# Patient Record
Sex: Male | Born: 1960 | Race: White | Hispanic: No | Marital: Single | Smoking: Current every day smoker
Health system: Southern US, Community
[De-identification: ages and names within clinical notes are randomized; demographics above are authoritative.]

## PROBLEM LIST (undated history)

## (undated) DIAGNOSIS — IMO0002 Reserved for concepts with insufficient information to code with codable children: Secondary | ICD-10-CM

## (undated) DIAGNOSIS — Z720 Tobacco use: Secondary | ICD-10-CM

---

## 2011-11-13 ENCOUNTER — Encounter (HOSPITAL_COMMUNITY): Payer: Self-pay | Admitting: Emergency Medicine

## 2011-11-13 ENCOUNTER — Emergency Department (HOSPITAL_COMMUNITY)
Admission: EM | Admit: 2011-11-13 | Discharge: 2011-11-13 | Disposition: A | Discharge status: Home / Self Care | Payer: Self-pay | Attending: Emergency Medicine | Admitting: Emergency Medicine

## 2011-11-13 ENCOUNTER — Emergency Department (HOSPITAL_COMMUNITY): Payer: Self-pay

## 2011-11-13 DIAGNOSIS — M62830 Muscle spasm of back: Secondary | ICD-10-CM

## 2011-11-13 DIAGNOSIS — M5380 Other specified dorsopathies, site unspecified: Secondary | ICD-10-CM | POA: Insufficient documentation

## 2011-11-13 DIAGNOSIS — R062 Wheezing: Secondary | ICD-10-CM | POA: Insufficient documentation

## 2011-11-13 DIAGNOSIS — R079 Chest pain, unspecified: Secondary | ICD-10-CM | POA: Insufficient documentation

## 2011-11-13 DIAGNOSIS — M549 Dorsalgia, unspecified: Secondary | ICD-10-CM | POA: Insufficient documentation

## 2011-11-13 HISTORY — DX: Reserved for concepts with insufficient information to code with codable children: IMO0002

## 2011-11-13 MED ORDER — HYDROMORPHONE HCL PF 1 MG/ML IJ SOLN
1.0000 mg | Freq: Once | INTRAMUSCULAR | Status: AC
  Administered 2011-11-13: 1 mg via INTRAMUSCULAR
  Filled 2011-11-13: qty 1

## 2011-11-13 MED ORDER — IBUPROFEN 600 MG PO TABS: 600.0000 mg | ORAL_TABLET | Freq: Four times a day (QID) | ORAL | Status: AC | PRN

## 2011-11-13 MED ORDER — OXYCODONE-ACETAMINOPHEN 5-325 MG PO TABS
1.0000 | ORAL_TABLET | Freq: Once | ORAL | Status: AC
  Administered 2011-11-13: 1 via ORAL
  Filled 2011-11-13: qty 1

## 2011-11-13 MED ORDER — CYCLOBENZAPRINE HCL 5 MG PO TABS: 5.0000 mg | ORAL_TABLET | Freq: Three times a day (TID) | ORAL | Status: AC | PRN

## 2011-11-13 MED ORDER — KETOROLAC TROMETHAMINE 60 MG/2ML IM SOLN
60.0000 mg | Freq: Once | INTRAMUSCULAR | Status: AC
  Administered 2011-11-13: 60 mg via INTRAMUSCULAR
  Filled 2011-11-13: qty 2

## 2011-11-13 MED ORDER — CYCLOBENZAPRINE HCL 10 MG PO TABS
10.0000 mg | ORAL_TABLET | Freq: Once | ORAL | Status: AC
  Administered 2011-11-13: 10 mg via ORAL
  Filled 2011-11-13: qty 1

## 2011-11-13 MED ORDER — CYCLOBENZAPRINE HCL 10 MG PO TABS: 5.0000 mg | ORAL_TABLET | Freq: Three times a day (TID) | ORAL | Status: AC | PRN

## 2011-11-13 NOTE — ED Provider Notes (Signed)
Medical screening examination/treatment/procedure(s) were performed by non-physician practitioner and as supervising physician I was immediately available for consultation/collaboration.  Donnetta Hutching, MD 11/13/11 2238

## 2011-11-13 NOTE — ED Notes (Signed)
Pt refuses to stay for observation time after Dilaudid  Injection. Left with wife.

## 2011-11-13 NOTE — Discharge Instructions (Signed)
Back Pain, Adult Low back pain is very common. About 1 in 5 people have back pain.The cause of low back pain is rarely dangerous. The pain often gets better over time.About half of people with a sudden onset of back pain feel better in just 2 weeks. About 8 in 10 people feel better by 6 weeks.  CAUSES Some common causes of back pain include:  Strain of the muscles or ligaments supporting the spine.   Wear and tear (degeneration) of the spinal discs.   Arthritis.   Direct injury to the back.  DIAGNOSIS Most of the time, the direct cause of low back pain is not known.However, back pain can be treated effectively even when the exact cause of the pain is unknown.Answering your caregiver's questions about your overall health and symptoms is one of the most accurate ways to make sure the cause of your pain is not dangerous. If your caregiver needs more information, he or she may order lab work or imaging tests (X-rays or MRIs).However, even if imaging tests show changes in your back, this usually does not require surgery. HOME CARE INSTRUCTIONS For many people, back pain returns.Since low back pain is rarely dangerous, it is often a condition that people can learn to manageon their own.   Remain active. It is stressful on the back to sit or stand in one place. Do not sit, drive, or stand in one place for more than 30 minutes at a time. Take short walks on level surfaces as soon as pain allows.Try to increase the length of time you walk each day.   Do not stay in bed.Resting more than 1 or 2 days can delay your recovery.   Do not avoid exercise or work.Your body is made to move.It is not dangerous to be active, even though your back may hurt.Your back will likely heal faster if you return to being active before your pain is gone.   Pay attention to your body when you bend and lift. Many people have less discomfortwhen lifting if they bend their knees, keep the load close to their  bodies,and avoid twisting. Often, the most comfortable positions are those that put less stress on your recovering back.   Find a comfortable position to sleep. Use a firm mattress and lie on your side with your knees slightly bent. If you lie on your back, put a pillow under your knees.   Only take over-the-counter or prescription medicines as directed by your caregiver. Over-the-counter medicines to reduce pain and inflammation are often the most helpful.Your caregiver may prescribe muscle relaxant drugs.These medicines help dull your pain so you can more quickly return to your normal activities and healthy exercise.   Put ice on the injured area.   Put ice in a plastic bag.   Place a towel between your skin and the bag.   Leave the ice on for 15 to 20 minutes, 3 to 4 times a day for the first 2 to 3 days. After that, ice and heat may be alternated to reduce pain and spasms.   Ask your caregiver about trying back exercises and gentle massage. This may be of some benefit.   Avoid feeling anxious or stressed.Stress increases muscle tension and can worsen back pain.It is important to recognize when you are anxious or stressed and learn ways to manage it.Exercise is a great option.  SEEK MEDICAL CARE IF:  You have pain that is not relieved with rest or medicine.   You have   pain that does not improve in 1 week.   You have new symptoms.   You are generally not feeling well.  SEEK IMMEDIATE MEDICAL CARE IF:   You have pain that radiates from your back into your legs.   You develop new bowel or bladder control problems.   You have unusual weakness or numbness in your arms or legs.   You develop nausea or vomiting.   You develop abdominal pain.   You feel faint.  Document Released: 06/30/2005 Document Revised: 06/19/2011 Document Reviewed: 11/18/2010 [facility] Patient Information 2012 [place], [place].   Start taking your  oxycodone as they are prescribed by Dr. Loney Hering.  You  may also try a muscle relaxer and anti-inflammatory as prescribed for additional pain relief.  Heating pad applied to your back for 20 minutes increments 4 times daily may also help relax these muscles.  Call Dr. Loney Hering for recheck if not improved by Monday.

## 2011-11-13 NOTE — ED Notes (Signed)
Pt c/o right back pain. Pt states he heard a "pop" while doing yard work yesterday.

## 2011-11-13 NOTE — ED Provider Notes (Signed)
History     CSN: [id number]  Arrival date & time 11/13/11  1539   First MD Initiated Contact with Patient 11/13/11 1549      Chief Complaint  Patient presents with  . Back Pain    (Consider location/radiation/quality/duration/timing/severity/associated sxs/prior treatment) HPI Comments: Patient presents for evaluation of right middle back pain with radiation up into his right shoulder and neck.  He has chronic cervical pain with known degenerative disc disease but was working in the yard yesterday when he felt a sudden popping sensation in his right mid back since he has had increasing pain which radiates per above with muscle spasm and decreased range of motion of his neck.  He does report decreased strength in his right upper extremity but this is chronic from prior injury.  He denies any numbness or tingling in his upper extremities.  No lower extremity complaints today.  Patient is a 51 y.o. male presenting with back pain. The history is provided by the patient.  Back Pain  The problem has been gradually worsening. The quality of the pain is described as stabbing, aching and cramping. The pain is at a severity of 9/10. The pain is severe. The symptoms are aggravated by twisting and certain positions. Pertinent negatives include no chest pain, no numbness, no abdominal pain, no bowel incontinence, no perianal numbness, no bladder incontinence, no dysuria, no paresthesias, no paresis and no tingling. He has tried nothing for the symptoms.    Past Medical History  Diagnosis Date  . Degenerative disc disease     History reviewed. No pertinent past surgical history.  No family history on file.  History  Substance Use Topics  . Smoking status: Current Everyday Smoker  . Smokeless tobacco: Not on file  . Alcohol Use: Yes     occasional      Review of Systems  Cardiovascular: Negative for chest pain.  Gastrointestinal: Negative for abdominal pain and bowel incontinence.    Genitourinary: Negative for bladder incontinence and dysuria.  Musculoskeletal: Positive for back pain.  Neurological: Negative for tingling, numbness and paresthesias.    Allergies  Review of patient's allergies indicates no known allergies.  Home Medications   Current Outpatient Rx  Name Route Sig Dispense Refill  . IBUPROFEN 200 MG PO TABS Oral Take 600 mg by mouth as needed. For pain    . CYCLOBENZAPRINE HCL 10 MG PO TABS Oral Take 0.5 tablets (5 mg total) by mouth 3 (three) times daily as needed for muscle spasms. 20 tablet 0  . CYCLOBENZAPRINE HCL 5 MG PO TABS Oral Take 1 tablet (5 mg total) by mouth 3 (three) times daily as needed for muscle spasms. 15 tablet 0  . IBUPROFEN 600 MG PO TABS Oral Take 1 tablet (600 mg total) by mouth every 6 (six) hours as needed for pain. 30 tablet 0    BP 129/65  Pulse 94  Temp 98 F (36.7 C)  Resp 20  Ht 5\' 9"  (1.753 m)  Wt 189 lb (85.73 kg)  BMI 27.91 kg/m2  SpO2 99%  Physical Exam  Nursing note and vitals reviewed. Constitutional: He appears well-developed and well-nourished.  HENT:  Head: Normocephalic and atraumatic.  Eyes: Conjunctivae are normal.  Neck: Muscular tenderness present. No spinous process tenderness present. Decreased range of motion present. No edema and no erythema present.    Cardiovascular: Normal rate, regular rhythm and intact distal pulses.   Pulmonary/Chest: Effort normal. No respiratory distress. He has wheezes.  Bilateral wheezing and lower lung fields which clears with cough.  Musculoskeletal: He exhibits tenderness.       Thoracic back: He exhibits tenderness and spasm.       Back:  Lymphadenopathy:    He has no cervical adenopathy.  Neurological: He is alert.  Skin: Skin is warm and dry.  Psychiatric: He has a normal mood and affect.    ED Course  Procedures (including critical care time)  Labs Reviewed - No data to display Dg Ribs Unilateral W/chest Right  11/13/2011  *RADIOLOGY  REPORT*  Clinical Data: Right chest and rib pain.  RIGHT RIBS AND CHEST - 3+ VIEW  Comparison: None  Findings: No fractures or other bone lesions are seen involving the right ribs.  The no evidence of pneumothorax or hemothorax.  Both lungs are clear.  Heart size is normal.  No evidence of mediastinal widening or tracheal deviation.  IMPRESSION: Negative.  Original Report Authenticated By: Danae Orleans, M.D.   Dg Thoracic Spine 2 View  11/13/2011  *RADIOLOGY REPORT*  Clinical Data: Back pain.  THORACIC SPINE - 2 VIEW  Comparison: None  Findings: Moderate degenerative changes throughout the thoracic spine.  The alignment is maintained.  No acute fracture.  No destructive bony changes.  No paraspinal soft tissue swelling.  The visualized lungs are clear.  IMPRESSION: Degenerative changes but no acute bony findings.  Original Report Authenticated By: P. Loralie Champagne, M.D.     1. Back pain   2. Muscle spasm of back     Patient given Toradol 60 mg IM, oxycodone x1 Flexeril 10 mg by mouth x1 with minimal relief of symptoms.  Added oxycodone x1 prior to discharge.  MDM  X-rays reviewed prior to discharge home and negative for acute injury.  No emergent signs or symptoms based on history and physical exam.  State narcotic database reviewed and patient does receive oxycodone 10-325  30 day supplies for the past 3 months by Dr. Loney Hering  In [place].  Discussed this with patient who states this medication does not work, so he did not even try taking it when his acute pain started yesterday.  He was strongly encouraged to take one or 2 doses if needed every 6 hours for relief.  He was also prescribed Flexeril 5 milligram tablets and ibuprofen 600 mg.  Heating pad 20 minutes 4 times daily.  Followup with Dr. Loney Hering if not improving over the weekend.        Burgess Amor, PA 11/13/11 1712  Burgess Amor, PA 11/13/11 [phone]

## 2012-12-22 IMAGING — CR DG THORACIC SPINE 2V
4 series · 4 of 4 positions shown · non-contrast
Comparison: None

CLINICAL DATA: Back pain.

THORACIC SPINE - 2 VIEW

[view not recorded (1 of 4)]
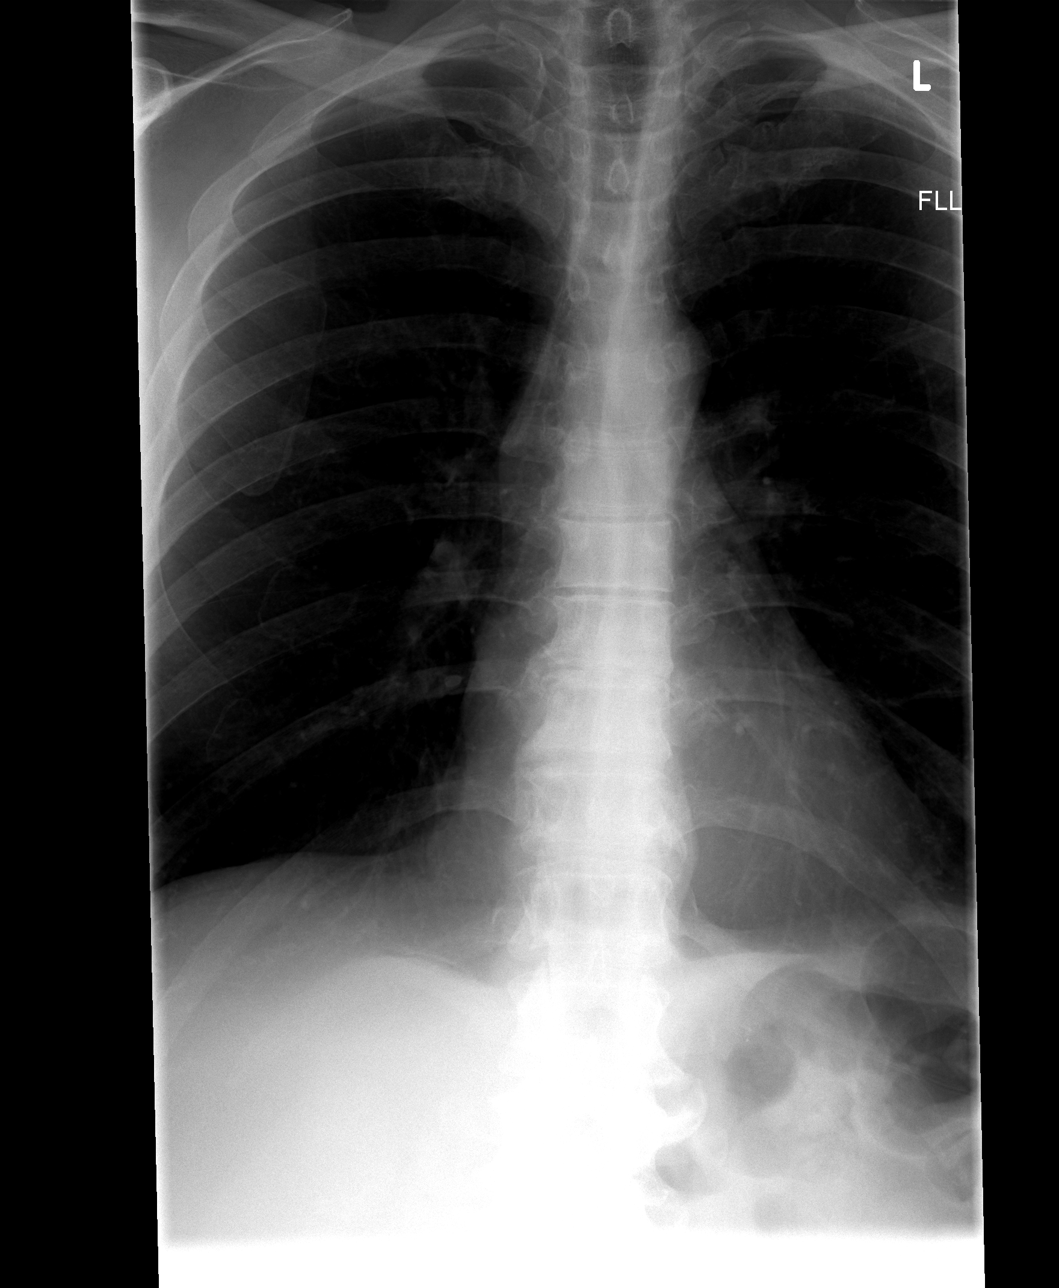

[view not recorded (2 of 4)]
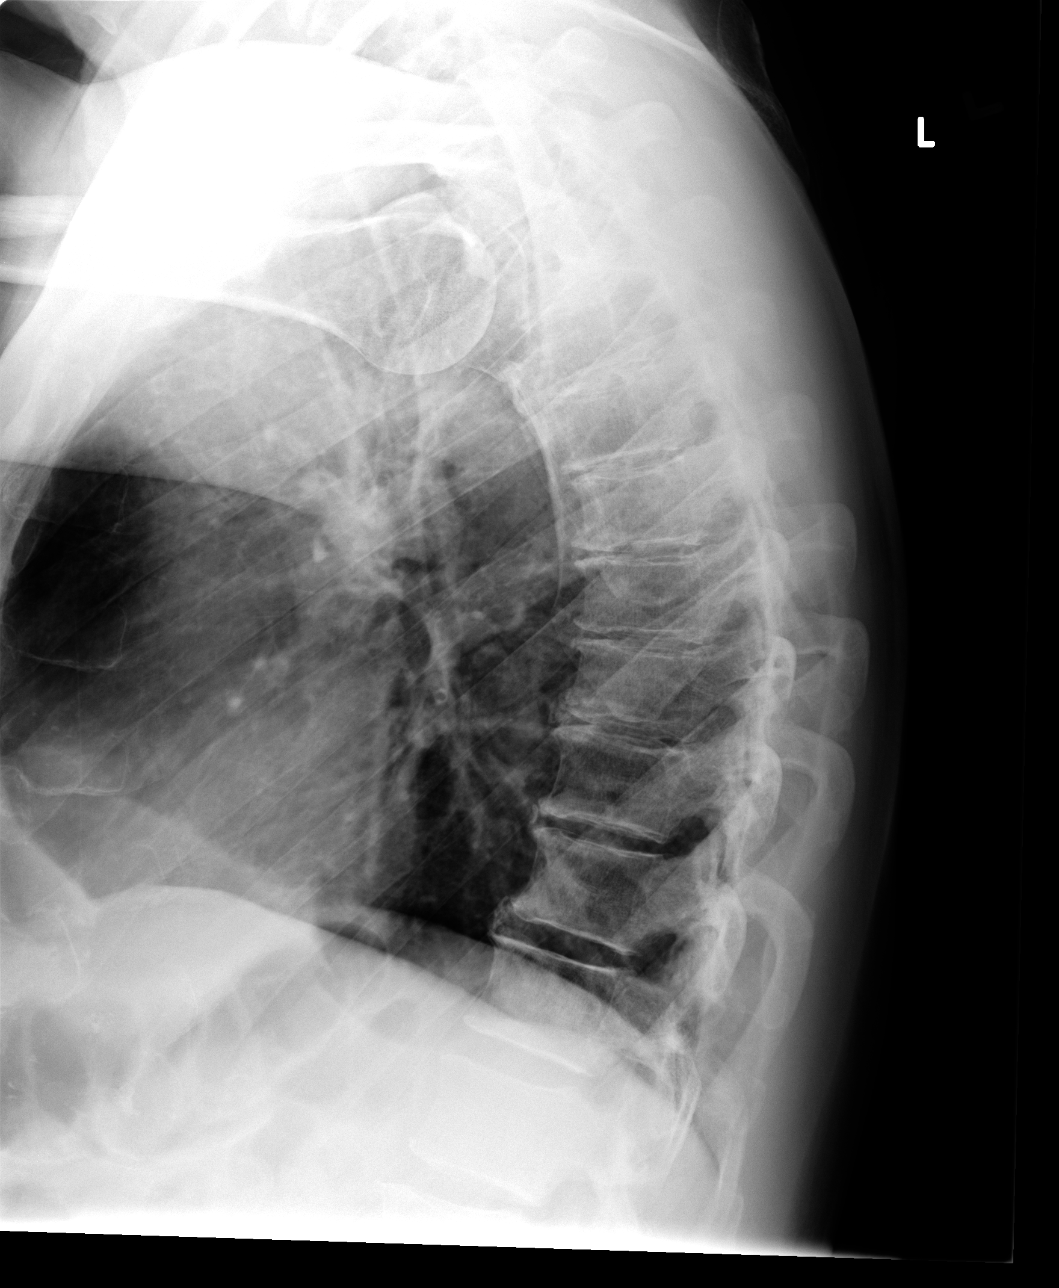

[view not recorded (3 of 4)]
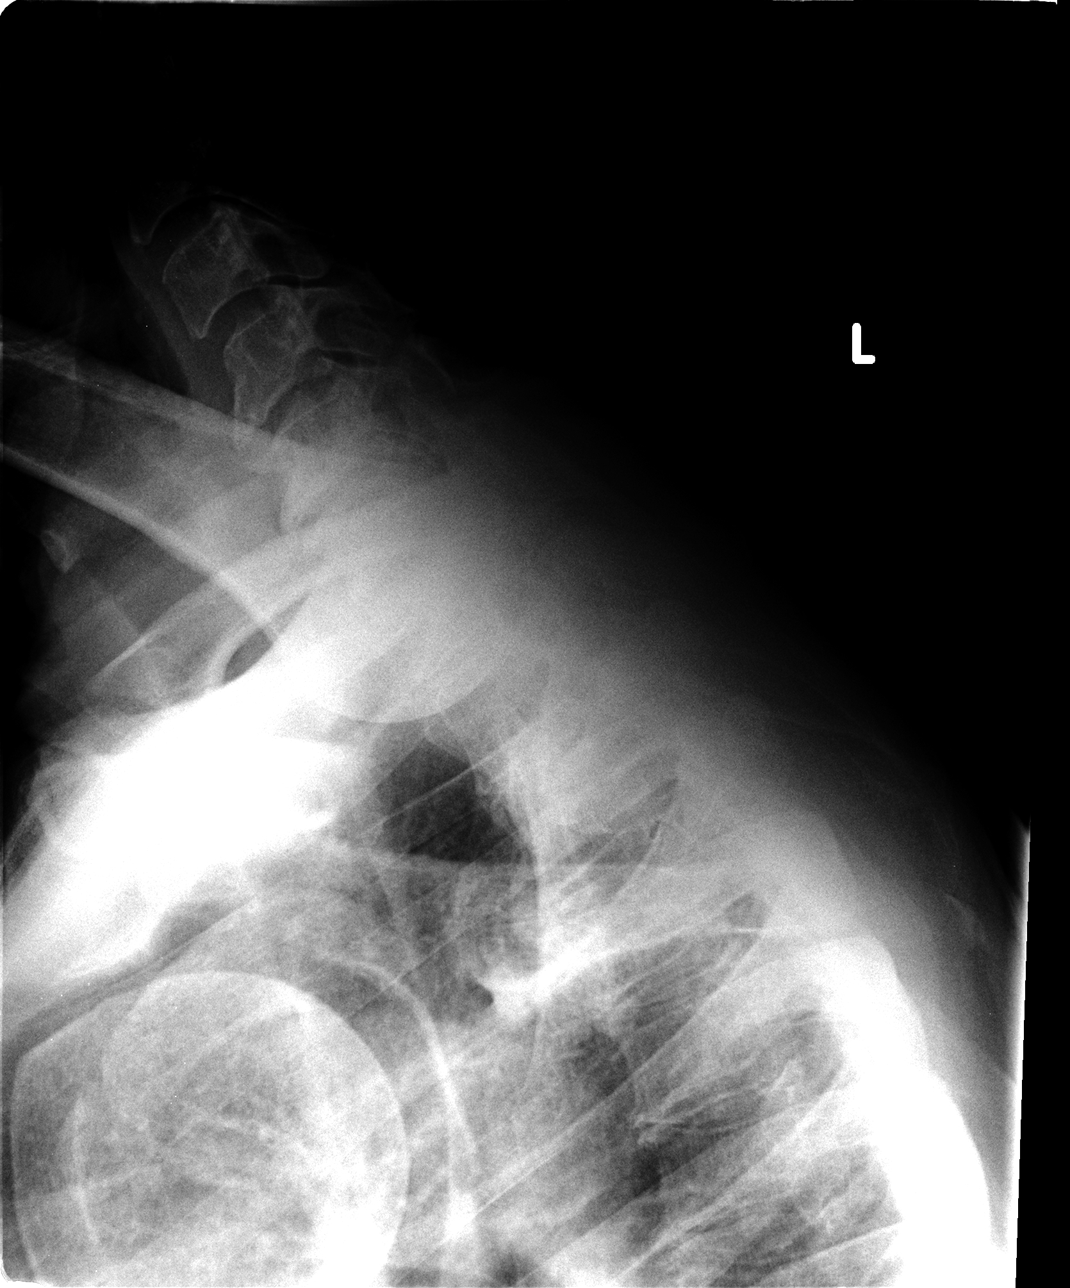

[view not recorded (4 of 4)]
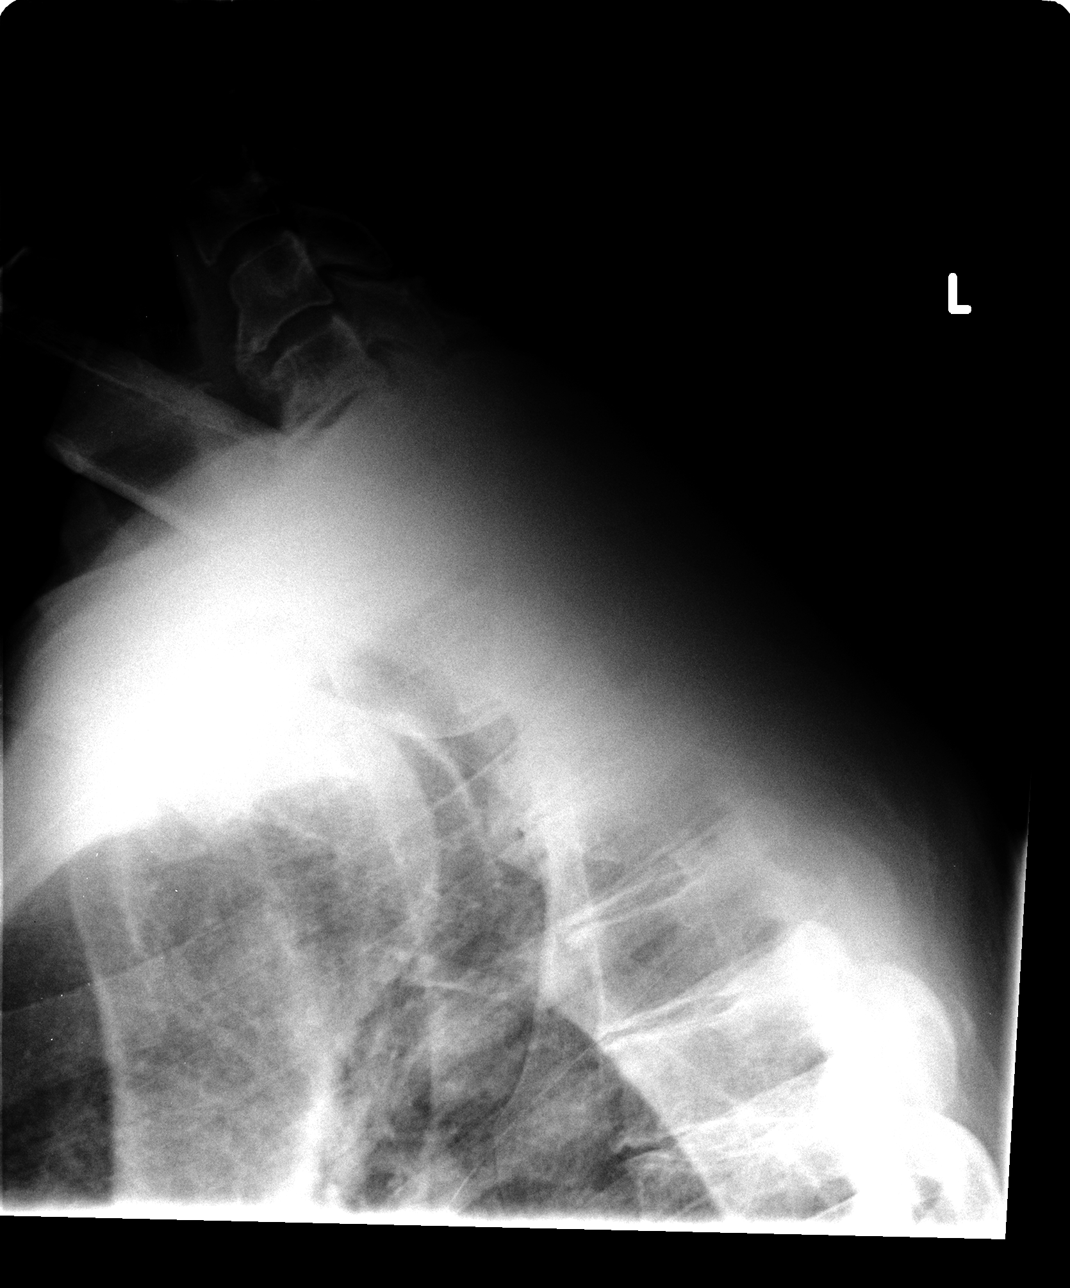

[4 of 4 positions shown; findings below may reference images not displayed]

FINDINGS: Moderate degenerative changes throughout the thoracic
spine.  The alignment is maintained.  No acute fracture.  No
destructive bony changes.  No paraspinal soft tissue swelling.  The
visualized lungs are clear.
IMPRESSION: Degenerative changes but no acute bony findings.

## 2015-03-13 ENCOUNTER — Telehealth: Payer: Self-pay

## 2015-03-13 NOTE — Telephone Encounter (Signed)
Pt was referred by Dr. Felecia Shelling for screening colonoscopy. Tried to call, VM not set up.

## 2015-03-21 NOTE — Telephone Encounter (Signed)
Tried to call again. VM not set up. Will mail letter to different address. [address] [place], [place] [address]. Letter to [place], [place] was returned.

## 2015-07-10 ENCOUNTER — Emergency Department (HOSPITAL_COMMUNITY)
Admission: EM | Admit: 2015-07-10 | Discharge: 2015-07-10 | Disposition: A | Discharge status: Home / Self Care | Payer: Medicaid Other | Attending: Emergency Medicine | Admitting: Emergency Medicine

## 2015-07-10 ENCOUNTER — Encounter (HOSPITAL_COMMUNITY): Payer: Self-pay | Admitting: *Deleted

## 2015-07-10 ENCOUNTER — Emergency Department (HOSPITAL_COMMUNITY): Payer: Medicaid Other

## 2015-07-10 ENCOUNTER — Other Ambulatory Visit: Payer: Self-pay

## 2015-07-10 DIAGNOSIS — M519 Unspecified thoracic, thoracolumbar and lumbosacral intervertebral disc disorder: Secondary | ICD-10-CM | POA: Diagnosis not present

## 2015-07-10 DIAGNOSIS — Z79899 Other long term (current) drug therapy: Secondary | ICD-10-CM | POA: Insufficient documentation

## 2015-07-10 DIAGNOSIS — J40 Bronchitis, not specified as acute or chronic: Secondary | ICD-10-CM | POA: Insufficient documentation

## 2015-07-10 DIAGNOSIS — J9801 Acute bronchospasm: Secondary | ICD-10-CM | POA: Diagnosis not present

## 2015-07-10 DIAGNOSIS — F17200 Nicotine dependence, unspecified, uncomplicated: Secondary | ICD-10-CM | POA: Diagnosis not present

## 2015-07-10 DIAGNOSIS — J209 Acute bronchitis, unspecified: Secondary | ICD-10-CM

## 2015-07-10 DIAGNOSIS — R0602 Shortness of breath: Secondary | ICD-10-CM | POA: Diagnosis present

## 2015-07-10 HISTORY — DX: Tobacco use: Z72.0

## 2015-07-10 LAB — CBC WITH DIFFERENTIAL/PLATELET
Basophils Absolute: 0 10*3/uL (ref 0.0–0.1)
Basophils Relative: 0 %
Eosinophils Absolute: 0.2 10*3/uL (ref 0.0–0.7)
Eosinophils Relative: 3 %
HCT: 47 % (ref 39.0–52.0)
Hemoglobin: 15.9 g/dL (ref 13.0–17.0)
Lymphocytes Relative: 27 %
Lymphs Abs: 2 10*3/uL (ref 0.7–4.0)
MCH: 30.9 pg (ref 26.0–34.0)
MCHC: 33.8 g/dL (ref 30.0–36.0)
MCV: 91.3 fL (ref 78.0–100.0)
Monocytes Absolute: 0.6 10*3/uL (ref 0.1–1.0)
Monocytes Relative: 9 %
Neutro Abs: 4.4 10*3/uL (ref 1.7–7.7)
Neutrophils Relative %: 61 %
Platelets: 200 10*3/uL (ref 150–400)
RBC: 5.15 MIL/uL (ref 4.22–5.81)
RDW: 13.7 % (ref 11.5–15.5)
WBC: 7.2 10*3/uL (ref 4.0–10.5)

## 2015-07-10 LAB — BASIC METABOLIC PANEL
Anion gap: 6 (ref 5–15)
BUN: 18 mg/dL (ref 6–20)
CHLORIDE: 104 mmol/L (ref 101–111)
CO2: 26 mmol/L (ref 22–32)
CREATININE: 0.94 mg/dL (ref 0.61–1.24)
Calcium: 9.1 mg/dL (ref 8.9–10.3)
Glucose, Bld: 89 mg/dL (ref 65–99)
POTASSIUM: 4 mmol/L (ref 3.5–5.1)
SODIUM: 136 mmol/L (ref 135–145)

## 2015-07-10 LAB — TROPONIN I

## 2015-07-10 MED ORDER — IPRATROPIUM BROMIDE 0.02 % IN SOLN
1.0000 mg | Freq: Once | RESPIRATORY_TRACT | Status: AC
  Administered 2015-07-10: 0.5 mg via RESPIRATORY_TRACT

## 2015-07-10 MED ORDER — ALBUTEROL (5 MG/ML) CONTINUOUS INHALATION SOLN
10.0000 mg/h | INHALATION_SOLUTION | Freq: Once | RESPIRATORY_TRACT | Status: AC
  Administered 2015-07-10: 10 mg via RESPIRATORY_TRACT

## 2015-07-10 MED ORDER — ALBUTEROL SULFATE HFA 108 (90 BASE) MCG/ACT IN AERS: 2.0000 | INHALATION_SPRAY | RESPIRATORY_TRACT | Status: AC | PRN

## 2015-07-10 MED ORDER — ALBUTEROL SULFATE (2.5 MG/3ML) 0.083% IN NEBU
5.0000 mg | INHALATION_SOLUTION | Freq: Once | RESPIRATORY_TRACT | Status: DC
  Filled 2015-07-10: qty 6

## 2015-07-10 MED ORDER — IPRATROPIUM BROMIDE 0.02 % IN SOLN
RESPIRATORY_TRACT | Status: AC
  Administered 2015-07-10: 0.5 mg via RESPIRATORY_TRACT
  Filled 2015-07-10: qty 5

## 2015-07-10 MED ORDER — IPRATROPIUM-ALBUTEROL 0.5-2.5 (3) MG/3ML IN SOLN
3.0000 mL | Freq: Once | RESPIRATORY_TRACT | Status: DC
  Filled 2015-07-10: qty 3

## 2015-07-10 MED ORDER — ALBUTEROL (5 MG/ML) CONTINUOUS INHALATION SOLN
INHALATION_SOLUTION | RESPIRATORY_TRACT | Status: AC
  Administered 2015-07-10: 10 mg via RESPIRATORY_TRACT
  Filled 2015-07-10: qty 20

## 2015-07-10 MED ORDER — BENZONATATE 100 MG PO CAPS: 100.0000 mg | ORAL_CAPSULE | Freq: Three times a day (TID) | ORAL | Status: AC | PRN

## 2015-07-10 MED ORDER — PREDNISONE 20 MG PO TABS: 40.0000 mg | ORAL_TABLET | Freq: Every day | ORAL | Status: AC

## 2015-07-10 NOTE — ED Notes (Signed)
PT ambulated in hallway >200 feet with no SOB noted and denied any SOB. Oxygen sats >92% with ambulation.

## 2015-07-10 NOTE — Discharge Instructions (Signed)
°Emergency Department Resource Guide °1) Find a Doctor and Pay Out of Pocket °Although you won't have to find out who is covered by your insurance plan, it is a good idea to ask around and get recommendations. You will then need to call the office and see if the doctor you have chosen will accept you as a new patient and what types of options they offer for patients who are self-pay. Some doctors offer discounts or will set up payment plans for their patients who do not have insurance, but you will need to ask so you aren't surprised when you get to your appointment. ° °2) Contact Your Local Health Department °Not all health departments have doctors that can see patients for sick visits, but many do, so it is worth a call to see if yours does. If you don't know where your local health department is, you can check in your phone book. The CDC also has a tool to help you locate your state's health department, and many state websites also have listings of all of their local health departments. ° °3) Find a Walk-in Clinic °If your illness is not likely to be very severe or complicated, you may want to try a walk in clinic. These are popping up all over the country in pharmacies, drugstores, and shopping centers. They're usually staffed by nurse practitioners or physician assistants that have been trained to treat common illnesses and complaints. They're usually fairly quick and inexpensive. However, if you have serious medical issues or chronic medical problems, these are probably not your best option. ° °No Primary Care Doctor: °- Call [facility] at  832-8000 - they can help you locate a primary care doctor that  accepts your insurance, provides certain services, etc. °- Physician Referral Service- 1-800-533-3463 ° °Chronic Pain Problems: °Organization         Address  Phone   Notes  °[facility]  (336) [phone] Patients need to be referred by their primary care doctor.  ° °Medication  Assistance: °Organization         Address  Phone   Notes  °[place] [facility] [address] °[place], [place] [address] (336) 641-8030 --Must be a resident of [place] °-- Must have NO insurance coverage whatsoever (no Medicaid/ Medicare, etc.) °-- The pt. MUST have a primary care doctor that directs their care regularly and follows them in the community °  °[facility]  (866) 331-1348   °[employer]  (888) 892-1162   ° °Agencies that provide inexpensive medical care: °Organization         Address  Phone   Notes  °[facility]  (336) 832-8035   °[facility]    (336) 832-7272   °[facility] [address] °[place], [place] [address] (336) 832-4777   °[facility] [address], °[place] (336) 271-4999   °[facility]    (336) 373-0678   °[facility]    (336) 272-1050   °[facility] ° [address], [place] Phone:  (336) 832-4444, Fax:  (336) 832-4440 Hours of Operation:  9 am - 6 pm, M-F.  Also accepts Medicaid/Medicare and self-pay.  °[facility] ° [address], [place] Phone: (336) [phone], Fax: (336) [phone]. Hours of Operation:  8:30 am - 5:30 pm, M-F.  Also accepts Medicaid and self-pay.  °[facility] [place] [address], [place] Phone: (336) 878-6027   °[facility] [address], [place], [place] (336)723-1848, Ext. 123 Mondays & Thursdays: 7-9 AM.  First 15 patients are seen on a first come, first serve basis. °  ° °Medicaid-accepting [place] Providers: ° °Organization         Address  Phone   Notes  °[facility] [address], [place] (336) 641-2100 Also accepts self-pay patients.  °[facility] [address], [place] ° (336) 856-9996   °[facility] [address], [place]  (336) 288-8857   °[facility] [address] [place] [address], [place] (336) 299-7000   °Veita Bland [address], [place]  ° (336) 373-1557 Only accepts [place] Access Medicaid patients after they have their name applied to their card.  ° °Self-Pay (no insurance) in [place]: ° °Organization         Address  Phone   Notes  °Sickle Cell Patients, [facility] [address], [place] (336) 832-1970   °[facility] [address], [place] (336) 832-4400   °[facility] ° [address], [place] (336) 992-4800   °[facility]/Dr. Osei-Bonsu ° [address] [place] [address], [place] or [address], [place] (336) [phone] Phone number for both [place] and [place] locations is the same.  °[facility] [address], [place] (336) 299-0000   °[facility] [address] [place] [address], [place] or [address] (336) 852-7530 °(336) 878-2260   °[facility] [address], [place] (336) 350-1642, phone; (336) 294-5005, fax Sees patients 1st and 3rd Saturday of every month.  Must not qualify for public or private insurance (i.e. Medicaid, Medicare, [place] Health Choice, Veterans' Benefits) • Household income should be no more than 200% of the poverty level •The clinic cannot treat you if you are pregnant or think you are pregnant • Sexually transmitted diseases are not treated at the clinic.  ° ° °Dental Care: °Organization         Address  Phone  Notes  °[place] [facility] [address], [place] (336) 641-6152 Accepts children up to age 21 who are enrolled in Medicaid or [place] Health Choice; pregnant women with a Medicaid card; and children who have applied for Medicaid or [place] Health Choice, but were declined, whose parents can pay a reduced fee at time of service.  °[place]  [facility] [place]  [address], [place] (336) 641-7733 Accepts children up to age 21 who are enrolled in Medicaid or [place] Health Choice; pregnant women with a Medicaid card; and children who have applied for Medicaid or [place] Health Choice, but were declined, whose parents can pay a reduced fee at time of service.  °[facility] ° [address], [place] (336) 641-4533 Patients are seen by appointment only. Walk-ins are not accepted. [facility] will see patients 18 years of age and older. °Monday - Tuesday (8am-5pm) °Most Wednesdays (8:30-5pm) °$30 per visit, cash only  °[facility] ° [address], [place] (336) 641-4533 Patients are seen by appointment only. Walk-ins are not accepted. [facility] will see patients 18 years of age and older. °One   Wednesday Evening (Monthly: Volunteer Based).  $30 per visit, cash only  °[facility]  (919) [phone] for adults; Children under age 4, call Graduate Pediatric Dentistry at ([phone]) [phone]. Children aged 4-14, please call ([phone]) [phone] to request a pediatric application. ° Dental services are provided in all areas of dental care including fillings, crowns and bridges, complete and partial dentures, implants, gum treatment, root canals, and extractions. Preventive care is also provided. Treatment is provided to both adults and children. °Patients are selected via a lottery and there is often a waiting list. °  °[facility] [address], °[place] ° (336) 763-8833 [email or web address] °  °[facility] [address], [place], [place] (336)723-1848, Ext. 123 Second and Fourth Thursday of each month, opens at 6:30 AM; Clinic ends at 9 AM.  Patients are seen on a first-come first-served basis, and a limited number are seen during each clinic.  ° °[facility] ° [address], [place], [place] (336) 723-7904    Eligibility Requirements °You must have lived in [place], [place], or [place] counties for at least the last three months. °  You cannot be eligible for state or federal sponsored healthcare insurance, including Veterans Administration, Medicaid, or Medicare. °  You generally cannot be eligible for healthcare insurance through your employer.  °  How to apply: °Eligibility screenings are held every Tuesday and Wednesday afternoon from 1:00 pm until 4:00 pm. You do not need an appointment for the interview!  °[facility] [address], [place], [place] 336-631-2330   °[facility]  336-342-8273   °[facility]  336-703-3100   °[facility]  336-570-6415   ° °Behavioral Health Resources in the Community: °Intensive Outpatient Programs °Organization         Address  Phone  Notes  °[facility] [address], [place], [place] 336-878-6098   °[facility] [address], [place], [place] 336-832-9800   °[facility] [address], [place], [place] ° 336-882-2125   °[facility] [address],  °[place], [place] 1-800-853-5163 or 336-641-4981   °Substance Abuse Resources °Organization         Address  Phone  Notes  °[facility]  336-882-2125   °[facility]  336-784-9470   °[facility]  336-285-9073   °[facility]  336-845-3988   °Residential & Outpatient Substance Abuse Program  1-800-659-3381   °Psychological Services °Organization         Address  Phone  Notes  °[facility]  336- 832-9600   °[facility]  336- 378-7881   °[facility] [address], [place] 1-800-853-5163 or 336-641-4981   ° °Mobile Crisis Teams °Organization         Address  Phone  Notes  °[facility], Mobile Crisis Care Unit  1-877-626-1772   °Assertive °[facility] ° [address]  [place], [place] 336-834-9664   °Sharon DeEsch [address] °[place] 336-554-5454   ° °Self-Help/Support Groups °Organization         Address  Phone             Notes  °[facility] of  - variety of support groups  336- 373-1402 Call for more information  °Narcotics Anonymous (NA), [facility] [address], °[place]  2 meetings at this location  ° °  Residential Treatment Programs Organization         Address  Phone  Notes  [facility] [address],    [place]  [phone]   [facility]  [address], [place] [phone], [place], [place] [phone]   [facility] [address], [place] [phone] Admissions: 8am-3pm M-F  [facility] [address]    [place], [place] [phone]) [phone]   [facility] [address], [place], [place] [phone]   [facility] [address]  [place], [place] [phone]   [facility] Intensive Outpatient [address], [place], [place] [phone]   [facility] ([facility] [address]  [place], [place] [phone] or [phone]   [facility] ([facility]) [address] [place], [place] [phone] Accepts Medicaid  [facility] [address]  [place] [phone] Substance Abuse/Addiction Treatment   [facility] Organization         Address  Phone  Notes  [facility]  671-243-1899(888) [phone]   Angie FavaJulie Brannon, PhD [address] [place], [place]   [phone](336) [phone] or [phone](336) [phone]   [facility]   [address] [place], [place] [phone]) [phone]   [facility] [address], [place], [place] [phone](336) [phone] Insurance/Medicaid/sponsorship through [facility] [address]                                    [place], [place] [phone]) [phone] Therapy/tele-psych/case    [facility] [address]   [place], [place] [phone](336) [phone]    Dr. Lolly MustacheArfeen  [phone]) [phone]   [facility]  [employer] [facility] 1) [address], [place] 2) [address], [place] 3)  [address], [place] [phone](336) [phone]  [phone](336) [phone]   [facility] Child Abuse Hotline [phone](336) [phone] or [phone](336) [phone] (After Hours)      Take the prescriptions as directed.  Use your albuterol inhaler (2 to 4 puffs) every 4 hours for the next 7 days, then as needed for cough, wheezing, or shortness of breath.  Call your regular medical doctor today to schedule a follow up appointment within the next 3 days.  Return to the Emergency Department immediately sooner if worsening.

## 2015-07-10 NOTE — ED Notes (Signed)
Pt c/o feeling shortness of breath x 3 days, productive cough with clear sputum.

## 2015-07-10 NOTE — ED Provider Notes (Signed)
CSN: [id number]     Arrival date & time 07/10/15  1128 History   First MD Initiated Contact with Patient 07/10/15 1212     Chief Complaint  Patient presents with  . Shortness of Breath      HPI  Pt was seen at 1220.   Per pt, c/o gradual onset and worsening of persistent cough, wheezing and SOB for the past 2 to 3 days. Describes his sputum as "clear." Pt continues to smoke cigarettes. Denies CP/palpitations, no back pain, no abd pain, no N/V/D, no fevers, no rash.     Past Medical History  Diagnosis Date  . Degenerative disc disease   . Tobacco use    History reviewed. No pertinent past surgical history.  Social History  Substance Use Topics  . Smoking status: Current Every Day Smoker -- 1.00 packs/day  . Smokeless tobacco: None  . Alcohol Use: Yes     Comment: occasional    Review of Systems ROS: Statement: All systems negative except as marked or noted in the HPI; Constitutional: Negative for fever and chills. ; ; Eyes: Negative for eye pain, redness and discharge. ; ; ENMT: Negative for ear pain, hoarseness, nasal congestion, sinus pressure and sore throat. ; ; Cardiovascular: Negative for chest pain, palpitations, diaphoresis, and peripheral edema. ; ; Respiratory: +SOB, wheezing, cough. Negative for stridor. ; ; Gastrointestinal: Negative for nausea, vomiting, diarrhea, abdominal pain, blood in stool, hematemesis, jaundice and rectal bleeding. . ; ; Genitourinary: Negative for dysuria, flank pain and hematuria. ; ; Musculoskeletal: Negative for back pain and neck pain. Negative for swelling and trauma.; ; Skin: Negative for pruritus, rash, abrasions, blisters, bruising and skin lesion.; ; Neuro: Negative for headache, lightheadedness and neck stiffness. Negative for weakness, altered level of consciousness , altered mental status, extremity weakness, paresthesias, involuntary movement, seizure and syncope.      Allergies  Review of patient's allergies indicates no known  allergies.  Home Medications   Prior to Admission medications   Medication Sig Start Date End Date Taking? Authorizing Provider  lisinopril (PRINIVIL,ZESTRIL) 20 MG tablet Take 20 mg by mouth daily.   Yes Historical Provider, MD  oxyCODONE-acetaminophen (PERCOCET) 10-325 MG tablet Take 1 tablet by mouth every 6 (six) hours as needed for pain.   Yes Historical Provider, MD  ibuprofen (ADVIL,MOTRIN) 200 MG tablet Take 600 mg by mouth as needed. For pain    Historical Provider, MD   BP 123/82 mmHg  Pulse 79  Temp(Src) 98.1 F (36.7 C) (Oral)  Resp 18  Ht  (1.753 m)  Wt 181 lb (82.101 kg)  BMI 26.72 kg/m2  SpO2 95% Physical Exam  1225: Physical examination:  Nursing notes reviewed; Vital signs and O2 SAT reviewed;  Constitutional: Well developed, Well nourished, Well hydrated, In no acute distress; Head:  Normocephalic, atraumatic; Eyes: EOMI, PERRL, No scleral icterus; ENMT: Mouth and pharynx normal, Mucous membranes moist; Neck: Supple, Full range of motion, No lymphadenopathy; Cardiovascular: Regular rate and rhythm, No gallop; Respiratory: Breath sounds coarse & equal bilaterally, insp/exp wheezes bilat. No audible wheezing. Speaking sentences, Normal respiratory effort/excursion; Chest: Nontender, Movement normal; Abdomen: Soft, Nontender, Nondistended, Normal bowel sounds; Genitourinary: No CVA tenderness; Extremities: Pulses normal, No tenderness, No edema, No calf edema or asymmetry.; Neuro: AA&Ox3, Major CN grossly intact.  Speech clear. No gross focal motor or sensory deficits in extremities.; Skin: Color normal, Warm, Dry.   ED Course  Procedures (including critical care time) Labs Review   Imaging Review  I  have personally reviewed and evaluated these images and lab results as part of my medical decision-making.   EKG Interpretation None      MDM  MDM Reviewed: previous chart, nursing note and vitals Reviewed previous: labs and ECG Interpretation: labs, ECG and  x-ray     ED ECG REPORT   Date: 07/10/2015  Rate: 82  Rhythm: normal sinus rhythm  QRS Axis: normal  Intervals: normal  ST/T Wave abnormalities: normal  Conduction Disutrbances:none  Narrative Interpretation:   Old EKG Reviewed: none available.   Results for orders placed or performed during the hospital encounter of 07/10/15  Basic metabolic panel  Result Value Ref Range   Sodium 136 135 - 145 mmol/L   Potassium 4.0 3.5 - 5.1 mmol/L   Chloride 104 101 - 111 mmol/L   CO2 26 22 - 32 mmol/L   Glucose, Bld 89 65 - 99 mg/dL   BUN 18 6 - 20 mg/dL   Creatinine, Ser 1.61 0.61 - 1.24 mg/dL   Calcium 9.1 8.9 - 09.6 mg/dL   GFR calc non Af Amer >60 >60 mL/min   GFR calc Af Amer >60 >60 mL/min   Anion gap 6 5 - 15  CBC with Differential  Result Value Ref Range   WBC 7.2 4.0 - 10.5 K/uL   RBC 5.15 4.22 - 5.81 MIL/uL   Hemoglobin 15.9 13.0 - 17.0 g/dL   HCT 04.5 40.9 - 81.1 %   MCV 91.3 78.0 - 100.0 fL   MCH 30.9 26.0 - 34.0 pg   MCHC 33.8 30.0 - 36.0 g/dL   RDW 91.4 78.2 - 95.6 %   Platelets 200 150 - 400 K/uL   Neutrophils Relative % 61 %   Neutro Abs 4.4 1.7 - 7.7 K/uL   Lymphocytes Relative 27 %   Lymphs Abs 2.0 0.7 - 4.0 K/uL   Monocytes Relative 9 %   Monocytes Absolute 0.6 0.1 - 1.0 K/uL   Eosinophils Relative 3 %   Eosinophils Absolute 0.2 0.0 - 0.7 K/uL   Basophils Relative 0 %   Basophils Absolute 0.0 0.0 - 0.1 K/uL  Troponin I  Result Value Ref Range   Troponin I <0.03 <0.031 ng/mL   Dg Chest 1 View 07/10/2015  CLINICAL DATA:  Question nipple shadows versus parenchymal lung lesions on recent chest radiograph EXAM: CHEST 1 VIEW COMPARISON:  Study obtained earlier in the day FINDINGS: Frontal chest with nipple markers obtained. The areas questioned on the chest radiograph earlier in the day are consistent with nipple shadows. There is no edema or consolidation. Heart size and pulmonary vascularity are normal. No adenopathy. There is a focal area of calcification  in the right carotid artery. IMPRESSION: No edema or consolidation. The nodular opacities noted on chest radiograph earlier in the day represent nipple shadows. There is right carotid artery calcification. Electronically Signed   By: Bretta Bang III M.D.   On: 07/10/2015 14:41   Dg Chest 2 View 07/10/2015  CLINICAL DATA:  Three day history of productive cough and shortness of breath. EXAM: CHEST  2 VIEW COMPARISON:  11/13/2011. FINDINGS: Cardiomediastinal silhouette unremarkable, unchanged. Hyperinflation. Prominent bronchovascular markings diffusely and moderate central peribronchial thickening, more so than on the prior examinations. Apparent nodular opacities projected over the bases on the PA image are likely vu nipple shadows. Lungs otherwise clear. No localized airspace consolidation. No pleural effusions. No pneumothorax. Normal pulmonary vascularity. Degenerative disc disease and spondylosis involving the thoracic spine. IMPRESSION: 1. Moderate changes of  acute bronchitis and/or asthma without focal airspace pneumonia. 2. Apparent nodular opacities projected over the lung bases on the PA image are likely nipple shadows. A repeat PA x-ray with nipple markers may be confirmatory. Electronically Signed   By: Hulan Saashomas  Lawrence M.D.   On: 07/10/2015 14:07    1545:  Pt states he "feels better" after neb and steroid.  NAD, lungs CTA bilat, no wheezing, resps easy, speaking full sentences, Sats 94-96% R/A.  Pt ambulated around the ED with Sats 92-94% R/A, resps easy, NAD.  Pt has gotten himself dressed and is walking around his exam room and the hallways requesting to be discharged. Tx symptomatically at this time. Dx and testing d/w pt.  Questions answered.  Verb understanding, agreeable to d/c home with outpt f/u.    Samuel JesterKathleen Baltazar P[place]ala, DO 07/12/15 2046

## 2015-07-18 ENCOUNTER — Telehealth: Payer: Self-pay

## 2015-07-18 NOTE — Telephone Encounter (Signed)
I have been unable to contact this patient by phone or mail to scheduled screening colonoscopy. I am faxing a letter to Dr. Felecia ShellingFanta for an update of info.

## 2016-08-18 IMAGING — DX DG CHEST 1V
1 series · 1 of 1 positions shown · non-contrast
Comparison: Study obtained earlier in the day

CLINICAL DATA: Question nipple shadows versus parenchymal lung
lesions on recent chest radiograph

EXAM:
CHEST 1 VIEW

[chest pa]
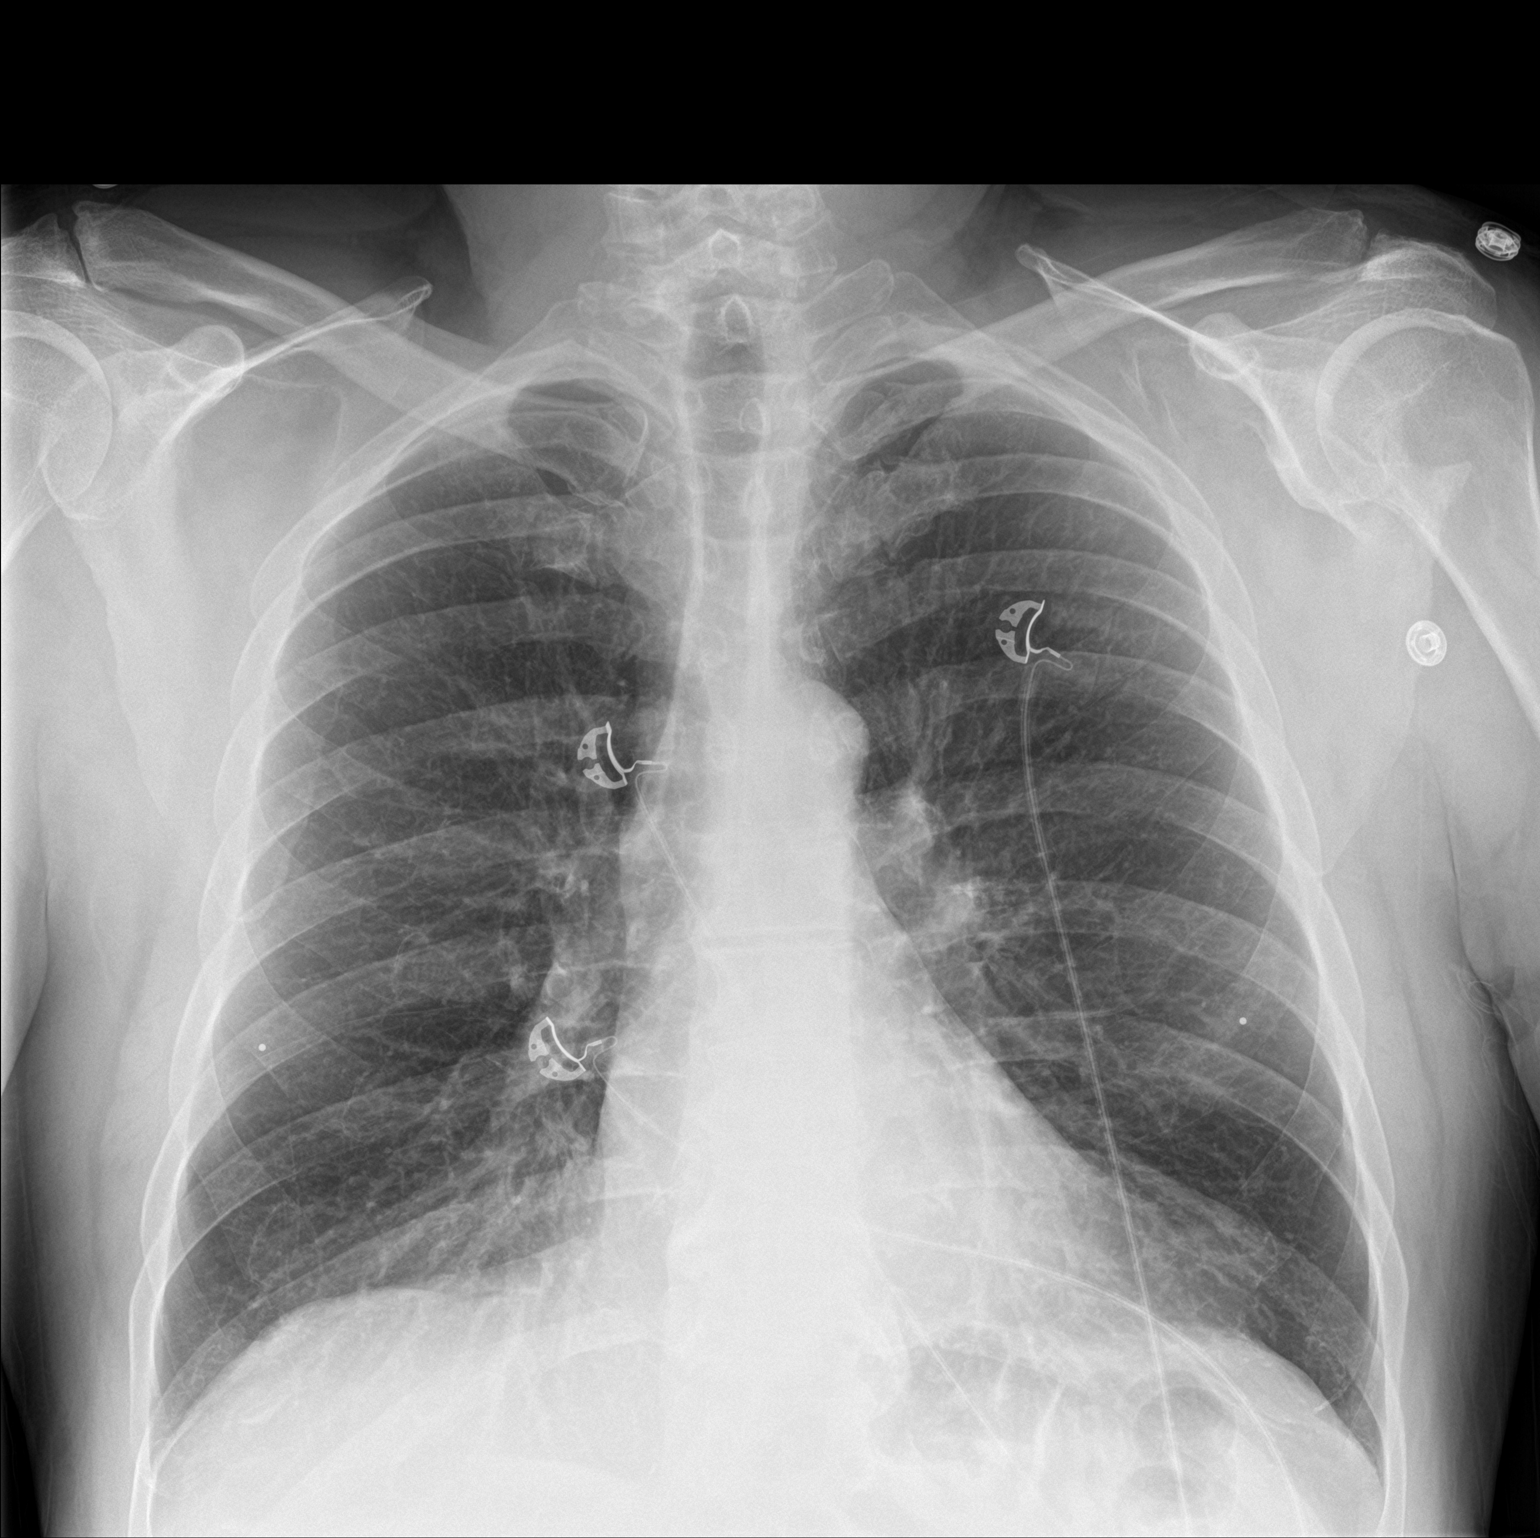

[1 of 1 positions shown; findings below may reference images not displayed]

FINDINGS: Frontal chest with nipple markers obtained. The areas questioned on
the chest radiograph earlier in the day are consistent with nipple
shadows. There is no edema or consolidation. Heart size and
pulmonary vascularity are normal. No adenopathy. There is a focal
area of calcification in the right carotid artery.
IMPRESSION: No edema or consolidation. The nodular opacities noted on chest
radiograph earlier in the day represent nipple shadows. There is
right carotid artery calcification.

## 2017-06-13 DEATH — deceased
